# Patient Record
Sex: Female | Born: 1955 | Race: White | Hispanic: No | Marital: Married | Smoking: Never smoker
Health system: Southern US, Community
[De-identification: ages and names within clinical notes are randomized; demographics above are authoritative.]

## PROBLEM LIST (undated history)

## (undated) DIAGNOSIS — J45909 Unspecified asthma, uncomplicated: Secondary | ICD-10-CM

## (undated) DIAGNOSIS — J302 Other seasonal allergic rhinitis: Secondary | ICD-10-CM

## (undated) HISTORY — DX: Unspecified asthma, uncomplicated: J45.909

## (undated) HISTORY — PX: WISDOM TOOTH EXTRACTION: SHX21

## (undated) HISTORY — PX: OTHER SURGICAL HISTORY: SHX169

## (undated) HISTORY — DX: Other seasonal allergic rhinitis: J30.2

---

## 1989-06-28 HISTORY — PX: CHOLECYSTECTOMY: SHX55

## 2000-04-14 ENCOUNTER — Encounter: Admission: RE | Admit: 2000-04-14 | Discharge: 2000-04-14 | Payer: Self-pay | Admitting: Obstetrics and Gynecology

## 2000-04-14 ENCOUNTER — Encounter: Payer: Self-pay | Admitting: Obstetrics and Gynecology

## 2002-05-07 ENCOUNTER — Encounter: Admission: RE | Admit: 2002-05-07 | Discharge: 2002-05-07 | Payer: Self-pay | Admitting: Obstetrics and Gynecology

## 2002-05-07 ENCOUNTER — Encounter: Payer: Self-pay | Admitting: Obstetrics and Gynecology

## 2005-06-17 ENCOUNTER — Encounter: Admission: RE | Admit: 2005-06-17 | Discharge: 2005-06-17 | Payer: Self-pay | Admitting: Obstetrics and Gynecology

## 2005-12-02 ENCOUNTER — Ambulatory Visit: Payer: Self-pay | Admitting: Licensed Clinical Social Worker

## 2005-12-07 ENCOUNTER — Ambulatory Visit: Payer: Self-pay | Admitting: Licensed Clinical Social Worker

## 2005-12-16 ENCOUNTER — Ambulatory Visit: Payer: Self-pay | Admitting: Licensed Clinical Social Worker

## 2005-12-21 ENCOUNTER — Ambulatory Visit: Payer: Self-pay | Admitting: Licensed Clinical Social Worker

## 2006-01-04 ENCOUNTER — Ambulatory Visit: Payer: Self-pay | Admitting: Licensed Clinical Social Worker

## 2006-01-25 ENCOUNTER — Ambulatory Visit: Payer: Self-pay | Admitting: Licensed Clinical Social Worker

## 2006-02-08 ENCOUNTER — Ambulatory Visit: Payer: Self-pay | Admitting: Licensed Clinical Social Worker

## 2007-04-04 ENCOUNTER — Encounter: Admission: RE | Admit: 2007-04-04 | Discharge: 2007-04-04 | Payer: Self-pay | Admitting: Obstetrics and Gynecology

## 2008-05-15 ENCOUNTER — Encounter: Admission: RE | Admit: 2008-05-15 | Discharge: 2008-05-15 | Payer: Self-pay | Admitting: Obstetrics and Gynecology

## 2009-08-13 ENCOUNTER — Encounter: Admission: RE | Admit: 2009-08-13 | Discharge: 2009-08-13 | Payer: Self-pay | Admitting: Obstetrics and Gynecology

## 2011-11-01 ENCOUNTER — Other Ambulatory Visit: Payer: Self-pay | Admitting: Obstetrics & Gynecology

## 2011-11-01 DIAGNOSIS — Z1231 Encounter for screening mammogram for malignant neoplasm of breast: Secondary | ICD-10-CM

## 2011-11-18 ENCOUNTER — Ambulatory Visit
Admission: RE | Admit: 2011-11-18 | Discharge: 2011-11-18 | Disposition: A | Discharge status: Home / Self Care | Payer: BC Managed Care – PPO | Source: Ambulatory Visit | Attending: Obstetrics & Gynecology | Admitting: Obstetrics & Gynecology

## 2011-11-18 DIAGNOSIS — Z1231 Encounter for screening mammogram for malignant neoplasm of breast: Secondary | ICD-10-CM

## 2013-07-26 ENCOUNTER — Other Ambulatory Visit: Payer: Self-pay

## 2013-07-26 DIAGNOSIS — Z1231 Encounter for screening mammogram for malignant neoplasm of breast: Secondary | ICD-10-CM

## 2013-08-14 ENCOUNTER — Ambulatory Visit: Payer: BC Managed Care – PPO

## 2013-08-28 ENCOUNTER — Ambulatory Visit
Admission: RE | Admit: 2013-08-28 | Discharge: 2013-08-28 | Disposition: A | Discharge status: Home / Self Care | Payer: Self-pay | Source: Ambulatory Visit

## 2013-08-28 DIAGNOSIS — Z1231 Encounter for screening mammogram for malignant neoplasm of breast: Secondary | ICD-10-CM

## 2014-09-06 ENCOUNTER — Encounter: Payer: Self-pay | Admitting: Gastroenterology

## 2014-10-29 ENCOUNTER — Ambulatory Visit (AMBULATORY_SURGERY_CENTER): Payer: BLUE CROSS/BLUE SHIELD | Admitting: *Deleted

## 2014-10-29 VITALS — Ht 71.5 in | Wt 199.6 lb

## 2014-10-29 DIAGNOSIS — Z1211 Encounter for screening for malignant neoplasm of colon: Secondary | ICD-10-CM

## 2014-10-29 MED ORDER — NA SULFATE-K SULFATE-MG SULF 17.5-3.13-1.6 GM/177ML PO SOLN: 1.0000 | Freq: Once | ORAL | Status: DC

## 2014-10-29 NOTE — Progress Notes (Signed)
Denies allergies to eggs or soy products. Denies complications with sedation or anesthesia. Denies O2 use. Denies use of diet or weight loss medications.  Emmi instructions declined for colonoscopy.  

## 2014-10-31 ENCOUNTER — Encounter: Payer: Self-pay | Admitting: Gastroenterology

## 2014-11-12 ENCOUNTER — Ambulatory Visit (AMBULATORY_SURGERY_CENTER): Payer: BLUE CROSS/BLUE SHIELD | Admitting: Gastroenterology

## 2014-11-12 ENCOUNTER — Encounter: Payer: Self-pay | Admitting: Gastroenterology

## 2014-11-12 VITALS — BP 113/84 | HR 64 | Temp 97.6°F | Resp 16 | Ht 71.5 in | Wt 199.0 lb

## 2014-11-12 DIAGNOSIS — Z1211 Encounter for screening for malignant neoplasm of colon: Secondary | ICD-10-CM | POA: Diagnosis present

## 2014-11-12 MED ORDER — SODIUM CHLORIDE 0.9 % IV SOLN: 500.0000 mL | INTRAVENOUS | Status: DC

## 2014-11-12 NOTE — Op Note (Signed)
[facility] [address] [place], [id number]   COLONOSCOPY PROCEDURE REPORT  PATIENT: Misty NeighborsMcgee-Grether, Nicey R  MR#: [id number] BIRTHDATE: 06/01/1956 , 58  yrs. old GENDER: female ENDOSCOPIST: Meryl DareMalcolm T Zaiyah Sottile, MD, [facility] REFERRED YQ:IHKVBY:Fred Andrey CampanileWilson, MD PROCEDURE DATE:  11/12/2014 PROCEDURE:   Colonoscopy, screening First Screening Colonoscopy - Avg.  risk and is 50 yrs.  old or older Yes.  Prior Negative Screening - Now for repeat screening. N/A  History of Adenoma - Now for follow-up colonoscopy & has been > or = to 3 yrs.  N/A  Polyps removed today? No Recommend repeat exam, <10 yrs? No ASA CLASS:   Class II INDICATIONS:Screening for colonic neoplasia and Colorectal Neoplasm Risk Assessment for this procedure is average risk. MEDICATIONS: Monitored anesthesia care and Propofol 200 mg IV DESCRIPTION OF PROCEDURE:   After the risks benefits and alternatives of the procedure were thoroughly explained, informed consent was obtained.  The digital rectal exam revealed no abnormalities of the rectum.   The LB [id number]  endoscope was introduced through the anus and advanced to the cecum, which was identified by both the appendix and ileocecal valve. No adverse events experienced.   The quality of the prep was good.  (Suprep was used)  The instrument was then slowly withdrawn as the colon was fully examined.    COLON FINDINGS: There was moderate diverticulosis noted in the sigmoid colon.   The colonic mucosa appeared normal at the splenic flexure, in the transverse colon, rectum, descending colon, at the cecum, ileocecal valve, hepatic flexure, and in the ascending colon.  Retroflexed views revealed external hemorrhoids. The time to cecum = 2.9 Withdrawal time = 10.5   The scope was withdrawn and the procedure completed. COMPLICATIONS: There were no immediate complications.  ENDOSCOPIC IMPRESSION: 1.   Moderate diverticulosis in the sigmoid colon 2.   The  colonic mucosa othrewise appeared normal 3.   Small external hemorrhoids  RECOMMENDATIONS: 1.  High fiber diet with liberal fluid intake. 2.  Continue current colorectal screening recommendations for "routine risk" patients with a repeat colonoscopy in 10 years.  eSigned:  Meryl DareMalcolm T Kimberely Mccannon, MD, [facility] 11/12/2014 11:07 AM

## 2014-11-12 NOTE — Progress Notes (Signed)
No problems noted in the recovery room. maw 

## 2014-11-12 NOTE — Progress Notes (Signed)
noegg or soy allergy

## 2014-11-12 NOTE — Patient Instructions (Signed)
YOU HAD AN ENDOSCOPIC PROCEDURE TODAY AT THE [facility]:   Refer to the procedure report that was given to you for any specific questions about what was found during the examination.  If the procedure report does not answer your questions, please call your gastroenterologist to clarify.  If you requested that your care partner not be given the details of your procedure findings, then the procedure report has been included in a sealed envelope for you to review at your convenience later.  YOU SHOULD EXPECT: Some feelings of bloating in the abdomen. Passage of more gas than usual.  Walking can help get rid of the air that was put into your GI tract during the procedure and reduce the bloating. If you had a lower endoscopy (such as a colonoscopy or flexible sigmoidoscopy) you may notice spotting of blood in your stool or on the toilet paper. If you underwent a bowel prep for your procedure, you may not have a normal bowel movement for a few days.  Please Note:  You might notice some irritation and congestion in your nose or some drainage.  This is from the oxygen used during your procedure.  There is no need for concern and it should clear up in a day or so.  SYMPTOMS TO REPORT IMMEDIATELY:   Following lower endoscopy (colonoscopy or flexible sigmoidoscopy):  Excessive amounts of blood in the stool  Significant tenderness or worsening of abdominal pains  Swelling of the abdomen that is new, acute  Fever of 100F or higher   For urgent or emergent issues, a gastroenterologist can be reached at any hour by calling [phone].   DIET: Your first meal following the procedure should be a small meal and then it is ok to progress to your normal diet. Heavy or fried foods are harder to digest and may make you feel nauseous or bloated.  Likewise, meals heavy in dairy and vegetables can increase bloating.  Drink plenty of fluids but you should avoid alcoholic beverages for 24  hours.  ACTIVITY:  You should plan to take it easy for the rest of today and you should NOT DRIVE or use heavy machinery until tomorrow (because of the sedation medicines used during the test).    FOLLOW UP: Our staff will call the number listed on your records the next business day following your procedure to check on you and address any questions or concerns that you may have regarding the information given to you following your procedure. If we do not reach you, we will leave a message.  However, if you are feeling well and you are not experiencing any problems, there is no need to return our call.  We will assume that you have returned to your regular daily activities without incident.  If any biopsies were taken you will be contacted by phone or by letter within the next 1-3 weeks.  Please call us at [phone] if you have not heard about the biopsies in 3 weeks.    SIGNATURES/CONFIDENTIALITY: You and/or your care partner have signed paperwork which will be entered into your electronic medical record.  These signatures attest to the fact that that the information above on your After Visit Summary has been reviewed and is understood.  Full responsibility of the confidentiality of this discharge information lies with you and/or your care-partner.    Handouts were given to your care partner on hemorrhoids, diverticulosis, and a high fiber diet with liberal fluid intake. You may resume   your current medications today. Await biopsy results. Please call if any questions or concerns.   

## 2014-11-12 NOTE — Progress Notes (Signed)
Report to PACU, RN, vss, BBS= Clear.  

## 2014-11-13 ENCOUNTER — Telehealth: Payer: Self-pay

## 2014-11-13 NOTE — Telephone Encounter (Signed)
No answer or voicemail to leave message 

## 2015-01-02 ENCOUNTER — Other Ambulatory Visit: Payer: Self-pay

## 2015-01-02 DIAGNOSIS — Z1231 Encounter for screening mammogram for malignant neoplasm of breast: Secondary | ICD-10-CM

## 2015-01-08 ENCOUNTER — Ambulatory Visit
Admission: RE | Admit: 2015-01-08 | Discharge: 2015-01-08 | Disposition: A | Discharge status: Home / Self Care | Payer: BLUE CROSS/BLUE SHIELD | Source: Ambulatory Visit

## 2015-01-08 DIAGNOSIS — Z1231 Encounter for screening mammogram for malignant neoplasm of breast: Secondary | ICD-10-CM

## 2016-02-11 ENCOUNTER — Other Ambulatory Visit: Payer: Self-pay | Admitting: Family Medicine

## 2016-02-11 DIAGNOSIS — Z1231 Encounter for screening mammogram for malignant neoplasm of breast: Secondary | ICD-10-CM

## 2016-02-25 ENCOUNTER — Ambulatory Visit
Admission: RE | Admit: 2016-02-25 | Discharge: 2016-02-25 | Disposition: A | Discharge status: Home / Self Care | Payer: BLUE CROSS/BLUE SHIELD | Source: Ambulatory Visit | Attending: Family Medicine | Admitting: Family Medicine

## 2016-02-25 DIAGNOSIS — Z1231 Encounter for screening mammogram for malignant neoplasm of breast: Secondary | ICD-10-CM

## 2017-03-25 ENCOUNTER — Other Ambulatory Visit: Payer: Self-pay | Admitting: Family Medicine

## 2017-03-25 DIAGNOSIS — Z1231 Encounter for screening mammogram for malignant neoplasm of breast: Secondary | ICD-10-CM

## 2017-04-12 ENCOUNTER — Ambulatory Visit: Payer: BLUE CROSS/BLUE SHIELD

## 2017-04-12 ENCOUNTER — Ambulatory Visit
Admission: RE | Admit: 2017-04-12 | Discharge: 2017-04-12 | Disposition: A | Discharge status: Home / Self Care | Payer: No Typology Code available for payment source | Source: Ambulatory Visit | Attending: Family Medicine | Admitting: Family Medicine

## 2017-04-12 DIAGNOSIS — Z1231 Encounter for screening mammogram for malignant neoplasm of breast: Secondary | ICD-10-CM

## 2018-04-07 ENCOUNTER — Other Ambulatory Visit: Payer: Self-pay | Admitting: Family Medicine

## 2018-04-07 ENCOUNTER — Other Ambulatory Visit: Payer: Self-pay | Admitting: Physician Assistant

## 2018-04-07 DIAGNOSIS — Z1231 Encounter for screening mammogram for malignant neoplasm of breast: Secondary | ICD-10-CM

## 2018-05-17 ENCOUNTER — Ambulatory Visit
Admission: RE | Admit: 2018-05-17 | Discharge: 2018-05-17 | Disposition: A | Discharge status: Home / Self Care | Payer: PRIVATE HEALTH INSURANCE | Source: Ambulatory Visit | Attending: Physician Assistant | Admitting: Physician Assistant

## 2018-05-17 DIAGNOSIS — Z1231 Encounter for screening mammogram for malignant neoplasm of breast: Secondary | ICD-10-CM

## 2019-07-11 ENCOUNTER — Other Ambulatory Visit: Payer: Self-pay | Admitting: *Deleted

## 2019-07-11 DIAGNOSIS — Z1231 Encounter for screening mammogram for malignant neoplasm of breast: Secondary | ICD-10-CM

## 2019-07-18 ENCOUNTER — Ambulatory Visit: Payer: PRIVATE HEALTH INSURANCE

## 2019-08-24 ENCOUNTER — Ambulatory Visit: Payer: PRIVATE HEALTH INSURANCE

## 2019-08-24 ENCOUNTER — Other Ambulatory Visit: Payer: Self-pay

## 2019-08-24 ENCOUNTER — Ambulatory Visit
Admission: RE | Admit: 2019-08-24 | Discharge: 2019-08-24 | Disposition: A | Discharge status: Home / Self Care | Payer: PRIVATE HEALTH INSURANCE | Source: Ambulatory Visit | Attending: *Deleted | Admitting: *Deleted

## 2019-08-24 DIAGNOSIS — Z1231 Encounter for screening mammogram for malignant neoplasm of breast: Secondary | ICD-10-CM

## 2020-09-09 ENCOUNTER — Other Ambulatory Visit: Payer: Self-pay | Admitting: *Deleted

## 2020-09-09 DIAGNOSIS — Z1231 Encounter for screening mammogram for malignant neoplasm of breast: Secondary | ICD-10-CM

## 2020-11-05 ENCOUNTER — Other Ambulatory Visit: Payer: Self-pay

## 2020-11-05 ENCOUNTER — Ambulatory Visit
Admission: RE | Admit: 2020-11-05 | Discharge: 2020-11-05 | Disposition: A | Discharge status: Home / Self Care | Payer: Medicare HMO | Source: Ambulatory Visit | Attending: *Deleted | Admitting: *Deleted

## 2020-11-05 DIAGNOSIS — Z1231 Encounter for screening mammogram for malignant neoplasm of breast: Secondary | ICD-10-CM

## 2020-12-02 ENCOUNTER — Emergency Department (HOSPITAL_COMMUNITY)
Admission: EM | Admit: 2020-12-02 | Discharge: 2020-12-02 | Disposition: A | Discharge status: Home / Self Care | Payer: Medicare HMO | Attending: Emergency Medicine | Admitting: Emergency Medicine

## 2020-12-02 ENCOUNTER — Emergency Department (HOSPITAL_COMMUNITY): Payer: Medicare HMO

## 2020-12-02 ENCOUNTER — Encounter (HOSPITAL_COMMUNITY): Payer: Self-pay | Admitting: *Deleted

## 2020-12-02 DIAGNOSIS — U071 COVID-19: Secondary | ICD-10-CM

## 2020-12-02 DIAGNOSIS — R0602 Shortness of breath: Secondary | ICD-10-CM | POA: Diagnosis present

## 2020-12-02 DIAGNOSIS — J45909 Unspecified asthma, uncomplicated: Secondary | ICD-10-CM | POA: Insufficient documentation

## 2020-12-02 DIAGNOSIS — Z28310 Unvaccinated for covid-19: Secondary | ICD-10-CM | POA: Insufficient documentation

## 2020-12-02 LAB — CBC WITH DIFFERENTIAL/PLATELET
Abs Immature Granulocytes: 0.01 10*3/uL (ref 0.00–0.07)
Basophils Absolute: 0 10*3/uL (ref 0.0–0.1)
Basophils Relative: 1 %
Eosinophils Absolute: 0.1 10*3/uL (ref 0.0–0.5)
Eosinophils Relative: 2 %
HCT: 46.4 % — ABNORMAL HIGH (ref 36.0–46.0)
Hemoglobin: 14.8 g/dL (ref 12.0–15.0)
Immature Granulocytes: 0 %
Lymphocytes Relative: 31 %
Lymphs Abs: 1.2 10*3/uL (ref 0.7–4.0)
MCH: 29.3 pg (ref 26.0–34.0)
MCHC: 31.9 g/dL (ref 30.0–36.0)
MCV: 91.9 fL (ref 80.0–100.0)
Monocytes Absolute: 0.5 10*3/uL (ref 0.1–1.0)
Monocytes Relative: 12 %
Neutro Abs: 2.1 10*3/uL (ref 1.7–7.7)
Neutrophils Relative %: 54 %
Platelets: 216 10*3/uL (ref 150–400)
RBC: 5.05 MIL/uL (ref 3.87–5.11)
RDW: 13.2 % (ref 11.5–15.5)
WBC: 3.8 10*3/uL — ABNORMAL LOW (ref 4.0–10.5)
nRBC: 0 % (ref 0.0–0.2)

## 2020-12-02 LAB — C-REACTIVE PROTEIN: CRP: 0.8 mg/dL (ref <1.0)

## 2020-12-02 LAB — COMPREHENSIVE METABOLIC PANEL
ALT: 18 U/L (ref 0–44)
AST: 30 U/L (ref 15–41)
Albumin: 3.8 g/dL (ref 3.5–5.0)
Alkaline Phosphatase: 53 U/L (ref 38–126)
Anion gap: 9 (ref 5–15)
BUN: 13 mg/dL (ref 8–23)
CO2: 24 mmol/L (ref 22–32)
Calcium: 8.7 mg/dL — ABNORMAL LOW (ref 8.9–10.3)
Chloride: 104 mmol/L (ref 98–111)
Creatinine, Ser: 0.82 mg/dL (ref 0.44–1.00)
GFR, Estimated: >60 mL/min (ref >60)
Glucose, Bld: 93 mg/dL (ref 70–99)
Potassium: 3.7 mmol/L (ref 3.5–5.1)
Sodium: 137 mmol/L (ref 135–145)
Total Bilirubin: 0.3 mg/dL (ref 0.3–1.2)
Total Protein: 6.6 g/dL (ref 6.5–8.1)

## 2020-12-02 LAB — BLOOD GAS, VENOUS
Acid-Base Excess: 2.8 mmol/L — ABNORMAL HIGH (ref 0.0–2.0)
Bicarbonate: 24.8 mmol/L (ref 20.0–28.0)
FIO2: 21
O2 Saturation: 37.2 %
Patient temperature: 37
pCO2, Ven: 48.5 mmHg (ref 44.0–60.0)
pH, Ven: 7.374 (ref 7.250–7.430)
pO2, Ven: <31 mmHg — CL (ref 32.0–45.0)

## 2020-12-02 LAB — HIV ANTIBODY (ROUTINE TESTING W REFLEX): HIV Screen 4th Generation wRfx: NONREACTIVE

## 2020-12-02 LAB — D-DIMER, QUANTITATIVE: D-Dimer, Quant: 0.65 ug/mL-FEU — ABNORMAL HIGH (ref 0.00–0.50)

## 2020-12-02 LAB — FERRITIN: Ferritin: 177 ng/mL (ref 11–307)

## 2020-12-02 MED ORDER — SODIUM CHLORIDE 0.9 % IV BOLUS
500.0000 mL | Freq: Once | INTRAVENOUS | Status: AC
  Administered 2020-12-02: 500 mL via INTRAVENOUS

## 2020-12-02 MED ORDER — SODIUM CHLORIDE 0.9 % IV BOLUS
1000.0000 mL | Freq: Once | INTRAVENOUS | Status: AC
  Administered 2020-12-02: 1000 mL via INTRAVENOUS

## 2020-12-02 MED ORDER — NIRMATRELVIR/RITONAVIR (PAXLOVID)TABLET: 3.0000 | ORAL_TABLET | Freq: Two times a day (BID) | ORAL | 0 refills | Status: AC

## 2020-12-02 MED ORDER — SODIUM CHLORIDE 0.9 % IV SOLN: INTRAVENOUS | Status: DC

## 2020-12-02 NOTE — ED Triage Notes (Signed)
States she tested positive yesterday at home for covid. States she has had episodes of low blood pressure ever since and cannot stand or ambulate without difficulty

## 2020-12-02 NOTE — Discharge Instructions (Addendum)
Please monitor your condition carefully and do not hesitate to return here for concerning changes. °

## 2020-12-02 NOTE — ED Provider Notes (Signed)
[facility] EMERGENCY DEPARTMENT Provider Note   CSN: [id number] Arrival date & time: 12/02/20  1410     History No chief complaint on file.   Misty Palmer is a 65 y.o. female.  HPI Patient presents with weakness, fatigue. Patient did not receive COVID-vaccine.  3 days ago she began feeling ill, with weakness, fatigue, chills.  With persistent symptoms she tested the following day for COVID, and had positive result.  Now she notes since that time she has had worsening symptoms, with inability to ambulate, perform ADL secondary to generalized weakness without focality. No fever, though she has ongoing chills.  No vomiting, no diarrhea. She has not taken OTC medication for relief. She notes that she was well prior to onset of symptoms.    Past Medical History:  Diagnosis Date  . Asthma   . Seasonal allergies     There are no problems to display for this patient.   Past Surgical History:  Procedure Laterality Date  . CHOLECYSTECTOMY  1991  . tonsils  childhood  . WISDOM TOOTH EXTRACTION       OB History   No obstetric history on file.     Family History  Problem Relation Age of Onset  . Colon cancer Mother   . Breast cancer Mother 82    Social History   Tobacco Use  . Smoking status: Never Smoker  . Smokeless tobacco: Never Used  Substance Use Topics  . Alcohol use: Yes    Alcohol/week: 0.0 standard drinks    Comment: occas  . Drug use: No    Home Medications Prior to Admission medications   Medication Sig Start Date End Date Taking? Authorizing Provider  albuterol (PROVENTIL HFA;VENTOLIN HFA) 108 (90 BASE) MCG/ACT inhaler Inhale into the lungs every 6 (six) hours as needed for wheezing or shortness of breath.    [provider]  Ascorbic Acid (VITAMIN C PO) Take by mouth.    [provider]  CALCIUM-MAGNESIUM-ZINC PO Take by mouth.    [provider]  Cholecalciferol (VITAMIN D PO) Take by mouth.    [provider]  Multiple Vitamins-Minerals (MULTIVITAMIN ADULT PO) Take by mouth.    [provider]  VITAMIN E PO Take by mouth.    [provider]    Allergies    Doxycycline and Penicillins  Review of Systems   Review of Systems  Constitutional:       Per HPI, otherwise negative  HENT:       Per HPI, otherwise negative  Respiratory:       Per HPI, otherwise negative  Cardiovascular:       Per HPI, otherwise negative  Gastrointestinal: Negative for vomiting.  Endocrine:       Negative aside from HPI  Genitourinary:       Neg aside from HPI   Musculoskeletal:       Per HPI, otherwise negative  Skin: Negative.   Neurological: Negative for syncope.    Physical Exam Updated Vital Signs BP 116/70   Pulse 74   Resp 19   SpO2 99%   Physical Exam Vitals and nursing note reviewed.  Constitutional:      General: She is not in acute distress.    Appearance: She is well-developed.  HENT:     Head: Normocephalic and atraumatic.  Eyes:     Conjunctiva/sclera: Conjunctivae normal.  Cardiovascular:     Rate and Rhythm: Normal rate and regular rhythm.  Pulmonary:  Effort: Pulmonary effort is normal. No respiratory distress.     Breath sounds: Normal breath sounds. No stridor.  Abdominal:     General: There is no distension.  Skin:    General: Skin is warm and dry.  Neurological:     Mental Status: She is alert and oriented to person, place, and time.     Cranial Nerves: No cranial nerve deficit.      ED Results / Procedures / Treatments   Labs (all labs ordered are listed, but only abnormal results are displayed) Labs Reviewed  BLOOD GAS, VENOUS - Abnormal; Notable for the following components:      Result Value   pO2, Ven <31.0 (*)    Acid-Base Excess 2.8 (*)    All other components within normal limits  COMPREHENSIVE METABOLIC PANEL - Abnormal; Notable for the following components:   Calcium 8.7 (*)    All other components within normal limits  CBC  WITH DIFFERENTIAL/PLATELET - Abnormal; Notable for the following components:   WBC 3.8 (*)    HCT 46.4 (*)    All other components within normal limits  D-DIMER, QUANTITATIVE - Abnormal; Notable for the following components:   D-Dimer, Quant 0.65 (*)    All other components within normal limits  HIV ANTIBODY (ROUTINE TESTING W REFLEX)  C-REACTIVE PROTEIN  FERRITIN    EKG EKG Interpretation  Date/Time:  Tuesday December 02 2020 17:10:17 EDT Ventricular Rate:  80 PR Interval:  167 QRS Duration: 98 QT Interval:  369 QTC Calculation: 426 R Axis:   69 Text Interpretation: Sinus rhythm unremarkable ECG Confirmed by Gerhard Munch [id number]) on 12/02/2020 5:45:39 PM   Radiology Portable chest 1 View  Result Date: 12/02/2020 CLINICAL DATA:  Episodes of hypotension after testing positive for COVID-19 yesterday. EXAM: PORTABLE CHEST 1 VIEW COMPARISON:  06/15/2016 FINDINGS: Normal sized heart. Clear lungs. Thoracic spine degenerative changes. IMPRESSION: No acute abnormality. Electronically Signed   By: Beckie Salts M.D.   On: 12/02/2020 15:25    Procedures Procedures   Medications Ordered in ED Medications  sodium chloride 0.9 % bolus 500 mL (has no administration in time range)  0.9 %  sodium chloride infusion (has no administration in time range)  sodium chloride 0.9 % bolus 1,000 mL (1,000 mLs Intravenous New Bag/Given 12/02/20 1715)    ED Course  I have reviewed the triage vital signs and the nursing notes.  Pertinent labs & imaging results that were available during my care of the patient were reviewed by me and considered in my medical decision making (see chart for details).    5:46 PM Patient improved, sitting upright, eating a cracker.  Blood pressure is improved.  Initial labs notable for mild elevation in D-dimer, but age-adjusted with years criteria, low risk for PE, and patient continues to require no additional oxygen, has no increased work of breathing.  6:41 PM Patient  markedly better, ready for discharge. Adult female without COVID vaccines presents after positive diagnosis a few days ago, now with fatigue, hypotension.  Patient's labs consistent with COVID, no evidence for concurrent pneumonia, no evidence for bacteremia, sepsis, no substantial electrolyte abnormalities, no organ dysfunction.  Patient improved substantially with fluid resuscitation, was ambulatory, without changes, was discharged in stable condition with outpatient antiviral therapy. MDM Rules/Calculators/A&P MDM Number of Diagnoses or Management Options COVID: new, needed workup Shortness of breath: new, needed workup   Amount and/or Complexity of Data Reviewed Clinical lab tests: ordered and reviewed Tests in the radiology section  of CPT: ordered and reviewed Tests in the medicine section of CPT: reviewed and ordered Decide to obtain previous medical records or to obtain history from someone other than the patient: yes Review and summarize past medical records: yes Independent visualization of images, tracings, or specimens: yes  Risk of Complications, Morbidity, and/or Mortality Presenting problems: high Diagnostic procedures: high Management options: high  Critical Care Total time providing critical care: < 30 minutes  Patient Progress Patient progress: improved  Final Clinical Impression(s) / ED Diagnoses Final diagnoses:  Shortness of breath  COVID    Rx / DC Orders ED Discharge Orders         Ordered    nirmatrelvir/ritonavir EUA (PAXLOVID) TABS  2 times daily        12/02/20 1748           Gerhard Munch, MD 12/02/20 1841

## 2021-09-30 IMAGING — DX DG CHEST 1V PORT
1 series · 1 of 1 positions shown · non-contrast
Comparison: 06/15/2016

CLINICAL DATA: Episodes of hypotension after testing positive for
[id number] yesterday.

EXAM:
PORTABLE CHEST 1 VIEW

[chest ap]
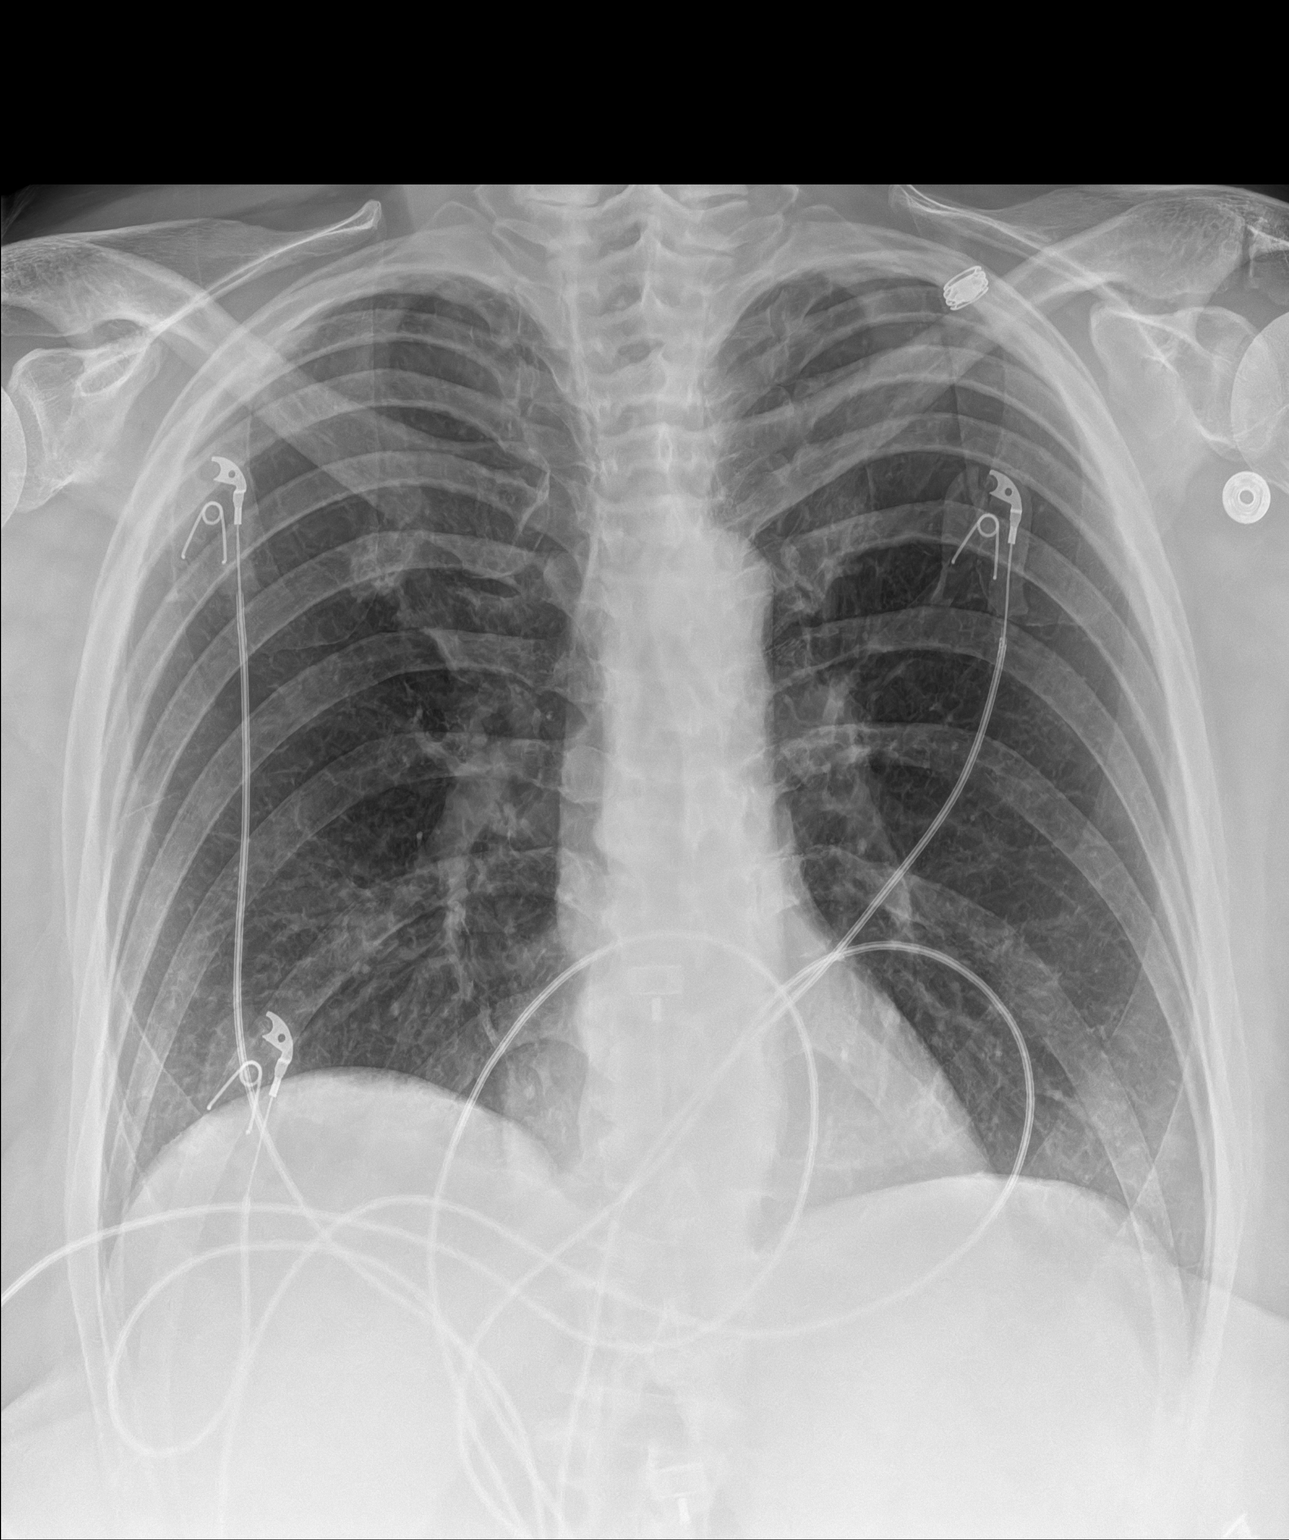

[1 of 1 positions shown; findings below may reference images not displayed]

FINDINGS: Normal sized heart. Clear lungs. Thoracic spine degenerative
changes.
IMPRESSION: No acute abnormality.

## 2021-10-12 ENCOUNTER — Other Ambulatory Visit: Payer: Self-pay | Admitting: *Deleted

## 2021-10-12 DIAGNOSIS — Z1231 Encounter for screening mammogram for malignant neoplasm of breast: Secondary | ICD-10-CM

## 2021-11-18 ENCOUNTER — Ambulatory Visit: Payer: Medicare HMO

## 2021-11-30 ENCOUNTER — Ambulatory Visit
Admission: RE | Admit: 2021-11-30 | Discharge: 2021-11-30 | Disposition: A | Discharge status: Home / Self Care | Payer: Medicare HMO | Source: Ambulatory Visit | Attending: *Deleted | Admitting: *Deleted

## 2021-11-30 DIAGNOSIS — Z1231 Encounter for screening mammogram for malignant neoplasm of breast: Secondary | ICD-10-CM

## 2022-09-28 IMAGING — MG MM DIGITAL SCREENING BILAT W/ TOMO AND CAD
8 series · 8 of 24 positions shown · non-contrast
Comparison: Previous exam(s).

CLINICAL DATA: Screening.

EXAM:
DIGITAL SCREENING BILATERAL MAMMOGRAM WITH TOMOSYNTHESIS AND CAD
TECHNIQUE: Bilateral screening digital craniocaudal and mediolateral oblique
mammograms were obtained. Bilateral screening digital breast
tomosynthesis was performed. The images were evaluated with
computer-aided detection.

[L CC synth-2D]
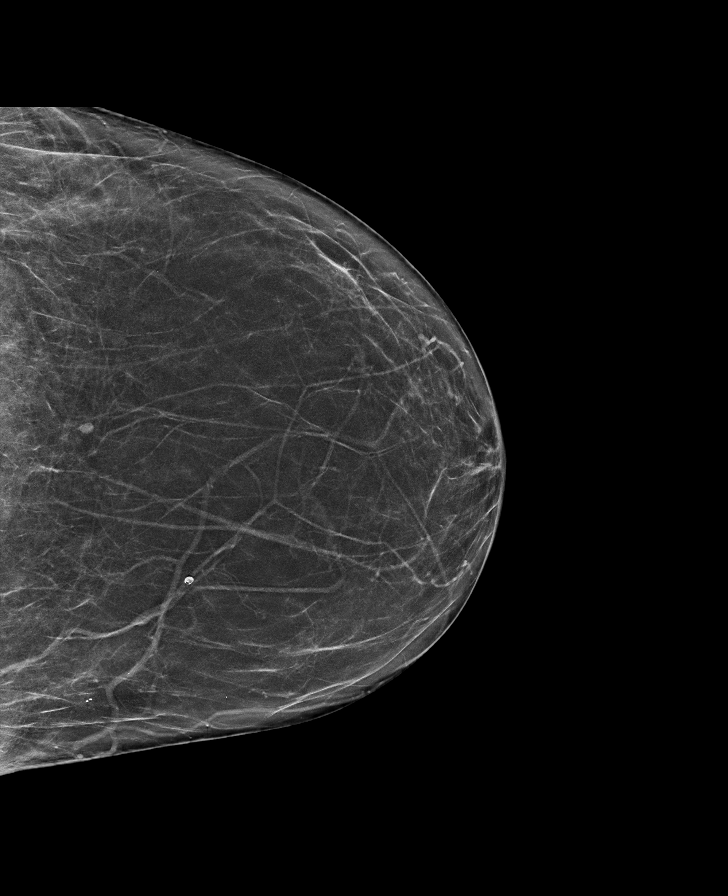

[R MLO synth-2D]
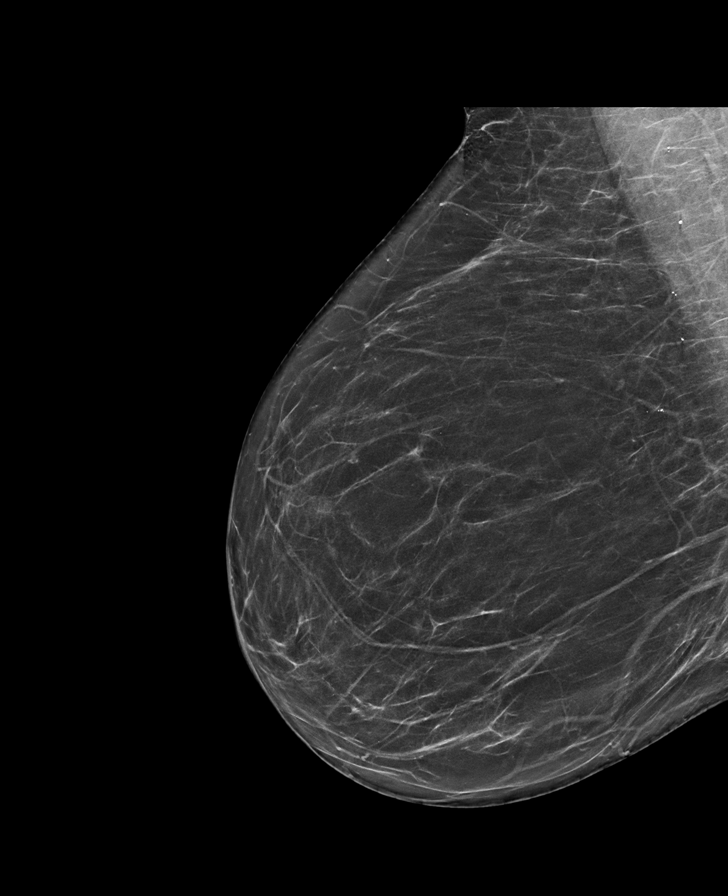

[L MLO synth-2D]
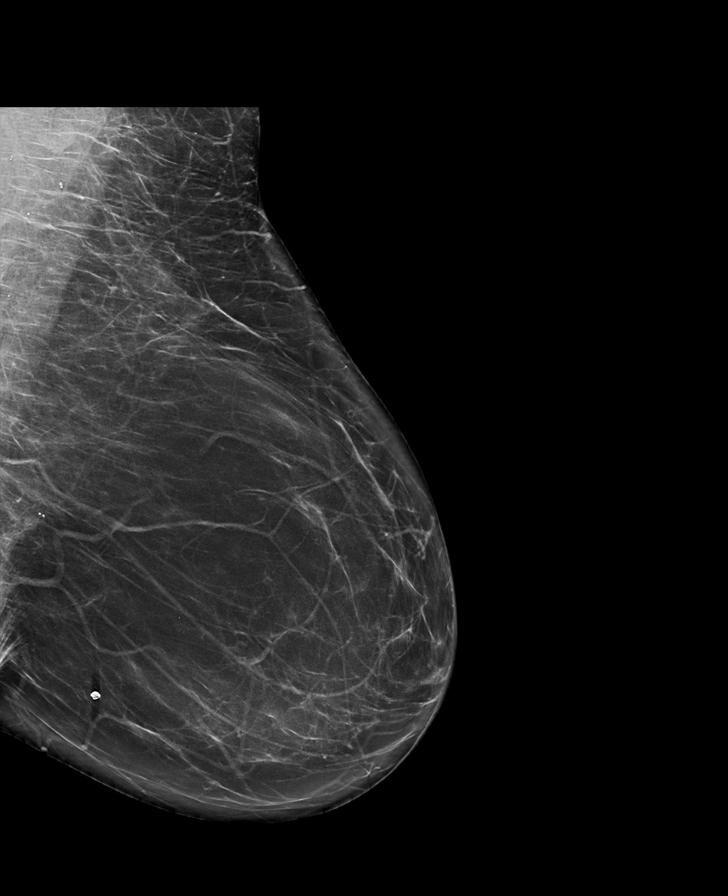

[R CC synth-2D]
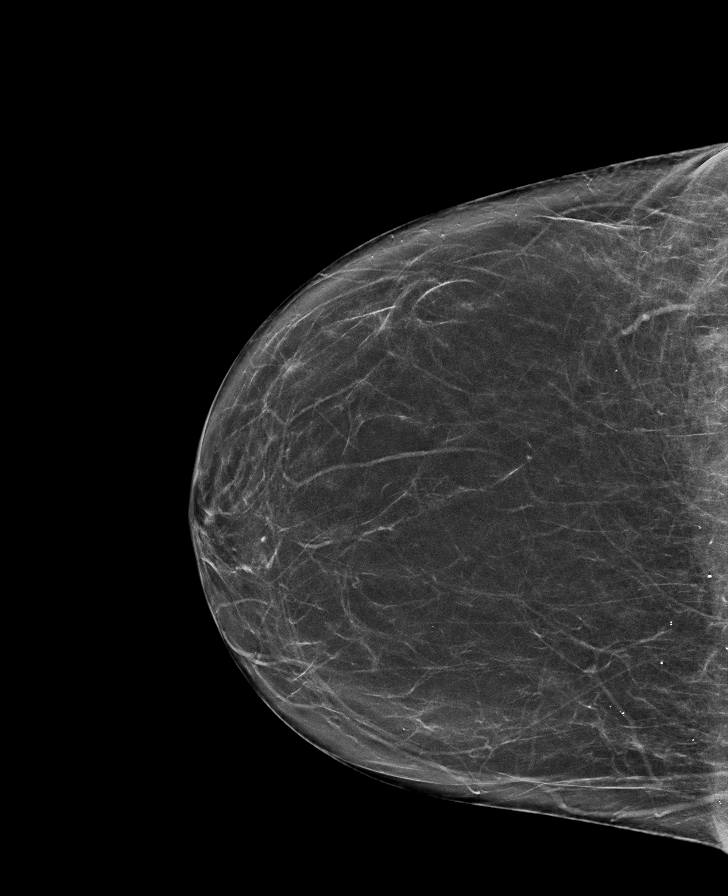

[L MLO tomo · tomo slice 40/79.0]
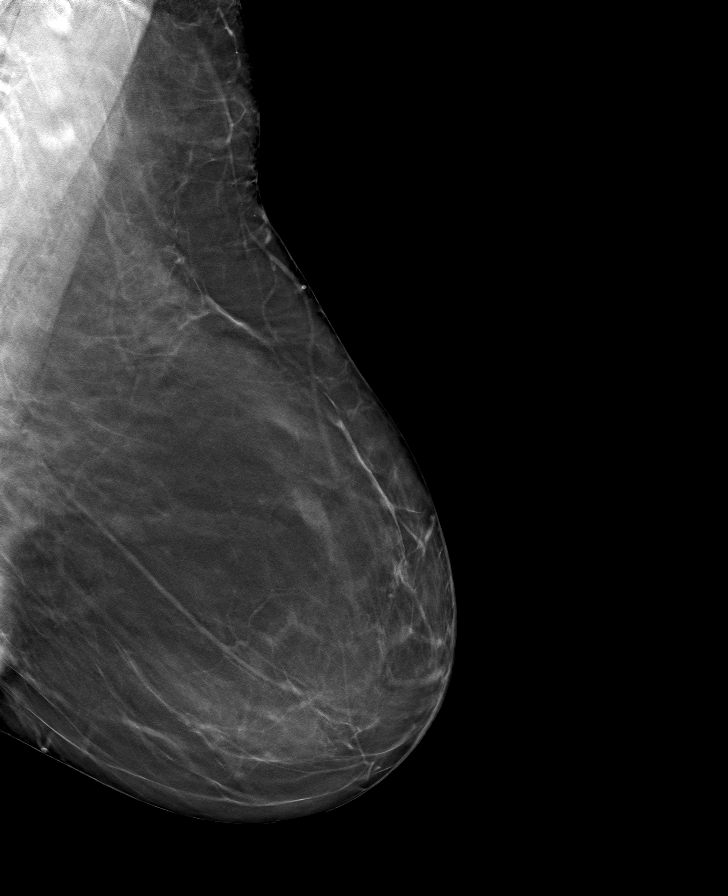

[L CC tomo · tomo slice 35/68.0]
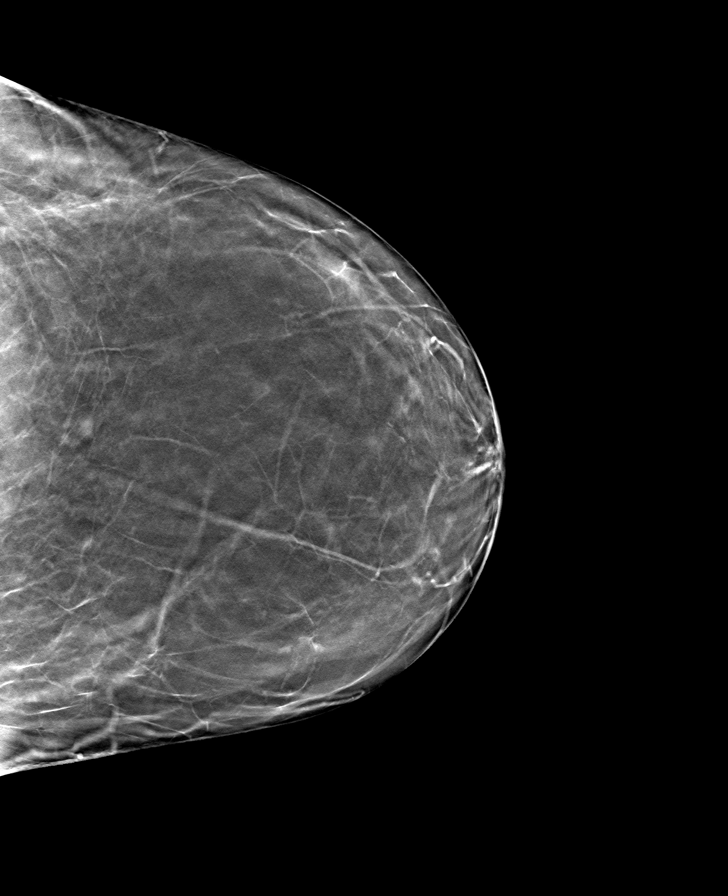

[R MLO tomo · tomo slice 37/73.0]
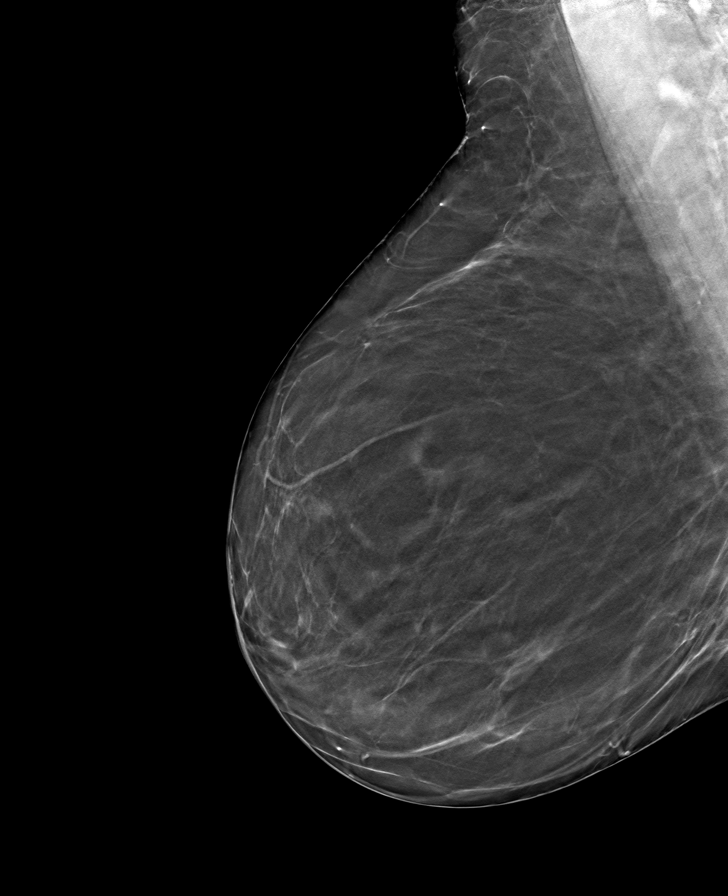

[R CC tomo · tomo slice 37/72.0]
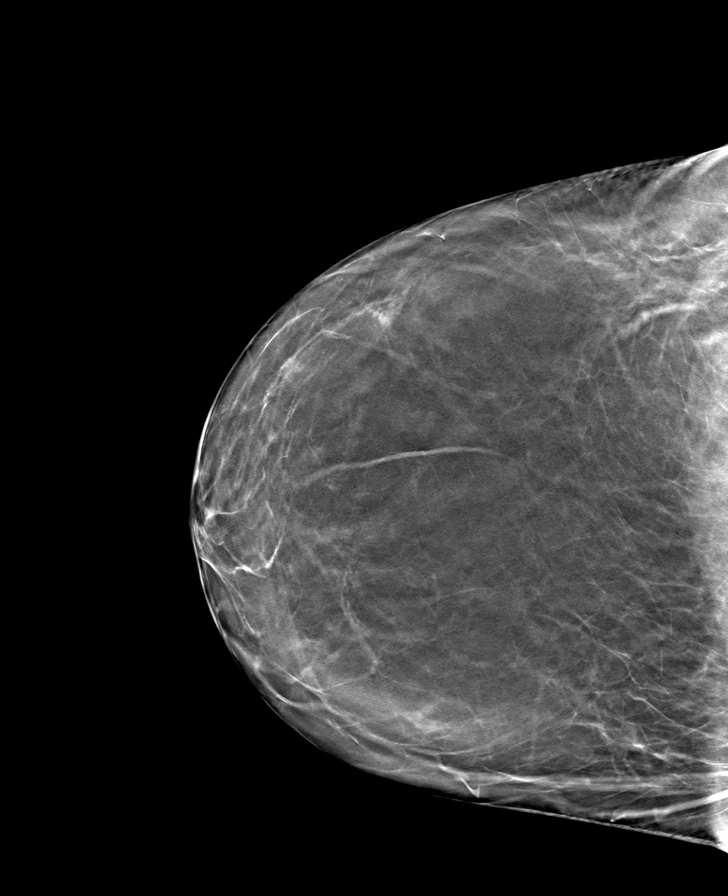

[8 of 24 positions shown; findings below may reference images not displayed]

ACR Breast Density Category b: There are scattered areas of
fibroglandular density.
FINDINGS: There are no findings suspicious for malignancy.
IMPRESSION: No mammographic evidence of malignancy. A result letter of this
screening mammogram will be mailed directly to the patient.

RECOMMENDATION:
Screening mammogram in one year. (Code:51-O-LD2)

BI-RADS CATEGORY  1: Negative.

## 2022-11-18 NOTE — Progress Notes (Signed)
Pt has PCP.Asked pt SDOH questions verbally. No SDOH needs at this time.

## 2022-11-19 ENCOUNTER — Other Ambulatory Visit: Payer: Self-pay | Admitting: *Deleted

## 2022-11-19 DIAGNOSIS — Z1231 Encounter for screening mammogram for malignant neoplasm of breast: Secondary | ICD-10-CM

## 2022-12-03 ENCOUNTER — Encounter: Payer: Self-pay | Admitting: *Deleted

## 2022-12-03 NOTE — Progress Notes (Signed)
Pt attended 11/18/22 screening event where her b/p was 128/74. At event, pt confirmed her PCP was at [facility] and that she did not have any SDOH insecurities. The PCP currently listed in [facility] does not document in [facility] so pt called to confirm that Marva Panda, NP is her current PCP and that pt has seen her within the past 12 months. During phone call with pt, pt confirmed that she still goes to [facility], and Ms Fredrik Cove has historically been her PCP, although she sees other providers in the clinic also. Pt states she is able to get all her medicines and health care as needed. No additional health equity team support indicated at this time.

## 2022-12-15 ENCOUNTER — Ambulatory Visit
Admission: RE | Admit: 2022-12-15 | Discharge: 2022-12-15 | Disposition: A | Discharge status: Home / Self Care | Payer: Medicare HMO | Source: Ambulatory Visit | Attending: *Deleted | Admitting: *Deleted

## 2022-12-15 DIAGNOSIS — Z1231 Encounter for screening mammogram for malignant neoplasm of breast: Secondary | ICD-10-CM

## 2023-03-10 LAB — AMB RESULTS CONSOLE CBG: Glucose: 85

## 2023-12-08 ENCOUNTER — Other Ambulatory Visit: Payer: Self-pay | Admitting: *Deleted

## 2023-12-08 DIAGNOSIS — Z1231 Encounter for screening mammogram for malignant neoplasm of breast: Secondary | ICD-10-CM

## 2024-01-04 ENCOUNTER — Ambulatory Visit
Admission: RE | Admit: 2024-01-04 | Discharge: 2024-01-04 | Disposition: A | Discharge status: Home / Self Care | Source: Ambulatory Visit | Attending: *Deleted | Admitting: *Deleted

## 2024-01-04 DIAGNOSIS — Z1231 Encounter for screening mammogram for malignant neoplasm of breast: Secondary | ICD-10-CM
